# Patient Record
Sex: Male | Born: 1985 | Hispanic: No | Marital: Single | Smoking: Current every day smoker
Health system: Southern US, Community
[De-identification: ages and names within clinical notes are randomized; demographics above are authoritative.]

## PROBLEM LIST (undated history)

## (undated) ENCOUNTER — Emergency Department (HOSPITAL_BASED_OUTPATIENT_CLINIC_OR_DEPARTMENT_OTHER): Payer: Self-pay

---
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
## 2012-01-17 ENCOUNTER — Encounter (HOSPITAL_COMMUNITY): Payer: Self-pay | Admitting: *Deleted

## 2012-01-17 ENCOUNTER — Emergency Department (HOSPITAL_COMMUNITY): Payer: No Typology Code available for payment source

## 2012-01-17 ENCOUNTER — Emergency Department (HOSPITAL_COMMUNITY)
Admission: EM | Admit: 2012-01-17 | Discharge: 2012-01-17 | Disposition: A | Discharge status: Home / Self Care | Payer: No Typology Code available for payment source | Attending: Emergency Medicine | Admitting: Emergency Medicine

## 2012-01-17 DIAGNOSIS — M25519 Pain in unspecified shoulder: Secondary | ICD-10-CM | POA: Insufficient documentation

## 2012-01-17 DIAGNOSIS — F17200 Nicotine dependence, unspecified, uncomplicated: Secondary | ICD-10-CM | POA: Insufficient documentation

## 2012-01-17 DIAGNOSIS — S40012A Contusion of left shoulder, initial encounter: Secondary | ICD-10-CM

## 2012-01-17 MED ORDER — CYCLOBENZAPRINE HCL 10 MG PO TABS: 10.0000 mg | ORAL_TABLET | Freq: Two times a day (BID) | ORAL | Status: AC | PRN

## 2012-01-17 MED ORDER — NAPROXEN 500 MG PO TABS: 500.0000 mg | ORAL_TABLET | Freq: Two times a day (BID) | ORAL | Status: AC
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
## 2018-01-11 ENCOUNTER — Emergency Department (HOSPITAL_COMMUNITY)
Admission: EM | Admit: 2018-01-11 | Discharge: 2018-01-12 | Disposition: A | Discharge status: Home / Self Care | Payer: Self-pay | Attending: Emergency Medicine | Admitting: Emergency Medicine

## 2018-01-11 ENCOUNTER — Other Ambulatory Visit: Payer: Self-pay

## 2018-01-11 ENCOUNTER — Encounter (HOSPITAL_COMMUNITY): Payer: Self-pay | Admitting: Emergency Medicine

## 2018-01-11 ENCOUNTER — Emergency Department (HOSPITAL_COMMUNITY): Payer: Self-pay

## 2018-01-11 DIAGNOSIS — F17210 Nicotine dependence, cigarettes, uncomplicated: Secondary | ICD-10-CM | POA: Insufficient documentation

## 2018-01-11 DIAGNOSIS — M545 Low back pain, unspecified: Secondary | ICD-10-CM

## 2018-01-11 DIAGNOSIS — R52 Pain, unspecified: Secondary | ICD-10-CM

## 2018-01-11 MED ORDER — KETOROLAC TROMETHAMINE 60 MG/2ML IM SOLN
15.0000 mg | Freq: Once | INTRAMUSCULAR | Status: AC
  Administered 2018-01-11: 15 mg via INTRAMUSCULAR
  Filled 2018-01-11: qty 2

## 2018-01-11 MED ORDER — METHOCARBAMOL 500 MG PO TABS: 500.0000 mg | ORAL_TABLET | Freq: Two times a day (BID) | ORAL | 0 refills | Status: DC

## 2018-01-11 NOTE — ED Provider Notes (Signed)
[facility] Provider Note   CSN: [id number] Arrival date & time: 01/11/18  2015     History   Chief Complaint Chief Complaint  Patient presents with  . Back Pain    HPI John Acosta is a 32 y.o. male presenting for right low back pain that began this morning approximately 7 AM.  Patient states that he was moving boxes at work, he works night shift, when he developed pain.  Patient states that pain gradually increased throughout the day.  Patient states that his pain is now 6/10 in severity, he describes as a throbbing pain that is worsened with leaning to the side and with deep breaths.  Patient has not taken anything for his pain. Patient denies history of fevers, headache, chest pain, shortness of breath, nausea, vomiting, urinary incontinence, fecal incontinence, numbness, weakness, tingling.  HPI  History reviewed. No pertinent past medical history.  There are no active problems to display for this patient.   History reviewed. No pertinent surgical history.      Home Medications    Prior to Admission medications   Medication Sig Start Date End Date Taking? Authorizing Provider  methocarbamol (ROBAXIN) 500 MG tablet Take 1 tablet (500 mg total) by mouth 2 (two) times daily. 01/11/18   Bill SalinasMorelli, Brandon A, PA-C    Family History No family history on file.  Social History Social History   Tobacco Use  . Smoking status: Current Every Day Smoker    Packs/day: 1.00  . Smokeless tobacco: Never Used  Substance Use Topics  . Alcohol use: Yes    Comment: occ  . Drug use: No     Allergies   Patient has no known allergies.   Review of Systems Review of Systems  Constitutional: Negative.  Negative for chills, fatigue and fever.  HENT: Negative.  Negative for congestion, rhinorrhea and sore throat.   Eyes: Negative.  Negative for visual disturbance.  Respiratory: Negative.  Negative for cough, chest tightness and  shortness of breath.   Cardiovascular: Negative.  Negative for chest pain.  Gastrointestinal: Negative.  Negative for abdominal pain, diarrhea, nausea and vomiting.  Genitourinary: Negative.  Negative for dysuria and hematuria.  Musculoskeletal: Positive for back pain. Negative for gait problem, neck pain and neck stiffness.  Skin: Negative.  Negative for rash and wound.  Neurological: Negative.  Negative for dizziness, syncope, weakness, light-headedness, numbness and headaches.     Physical Exam Updated Vital Signs BP 124/89 (BP Location: Right Arm)   Pulse (!) 58   Temp 97.9 F (36.6 C) (Oral)   Resp 16   Ht 5\' 8"  (1.727 m)   Wt 80.7 kg (178 lb)   SpO2 100%   BMI 27.06 kg/m   Physical Exam  Constitutional: He is oriented to person, place, and time. He appears well-developed and well-nourished. No distress.  HENT:  Head: Normocephalic and atraumatic.  Right Ear: External ear normal.  Left Ear: External ear normal.  Nose: Nose normal.  Mouth/Throat: Oropharynx is clear and moist.  Eyes: Pupils are equal, round, and reactive to light. EOM are normal.  Neck: Normal range of motion. Neck supple. No JVD present. No tracheal deviation present.  Cardiovascular: Normal rate, regular rhythm and normal heart sounds.  Pulmonary/Chest: Effort normal and breath sounds normal. No accessory muscle usage or stridor. No respiratory distress. He has no decreased breath sounds. He has no wheezes. He has no rhonchi. He has no rales. He exhibits no tenderness, no  crepitus and no deformity.  Abdominal: Soft. There is no tenderness. There is no rebound and no guarding.  Musculoskeletal: Normal range of motion. He exhibits no edema or deformity.       Cervical back: Normal. He exhibits no tenderness, no bony tenderness, no swelling and no deformity.       Thoracic back: Normal. He exhibits no tenderness, no bony tenderness, no swelling and no deformity.       Lumbar back: Normal. He exhibits no bony  tenderness, no swelling and no deformity.       Back:  No midline spine TTP, no paraspinal muscle tenderness, no deformity, crepitus, or step-off noted   Neurological: He is alert and oriented to person, place, and time. He has normal strength. No cranial nerve deficit or sensory deficit.  Skin: Skin is warm and dry. He is not diaphoretic.  Psychiatric: He has a normal mood and affect. His behavior is normal.     ED Treatments / Results  Labs (all labs ordered are listed, but only abnormal results are displayed) Labs Reviewed - No data to display  EKG None  Radiology  Dg Chest 2 View  Result Date: 01/11/2018 CLINICAL DATA:  32 y/o  M; right posterior rib pain. EXAM: CHEST - 2 VIEW; RIGHT RIBS - 2 VIEW COMPARISON:  None. FINDINGS: Chest: The heart size and mediastinal contours are within normal limits. Both lungs are clear. The visualized skeletal structures are unremarkable. Right ribs: No acute fracture or dislocation.  No suspicious osseous lesion. IMPRESSION: 1. No acute pulmonary process. 2. No acute osseous abnormality identified. Electronically Signed   By: Mitzi Hansen M.D.   On: 01/11/2018 22:07   Dg Ribs Unilateral Right  Result Date: 01/11/2018 CLINICAL DATA:  32 y/o  M; right posterior rib pain. EXAM: CHEST - 2 VIEW; RIGHT RIBS - 2 VIEW COMPARISON:  None. FINDINGS: Chest: The heart size and mediastinal contours are within normal limits. Both lungs are clear. The visualized skeletal structures are unremarkable. Right ribs: No acute fracture or dislocation.  No suspicious osseous lesion. IMPRESSION: 1. No acute pulmonary process. 2. No acute osseous abnormality identified. Electronically Signed   By: Mitzi Hansen M.D.   On: 01/11/2018 22:07    Procedures Procedures (including critical care time)  Medications Ordered in ED Medications  ketorolac (TORADOL) injection 15 mg (15 mg Intramuscular Given 01/11/18 2307)     Initial Impression / Assessment  and Plan / ED Course  I have reviewed the triage vital signs and the nursing notes.  Pertinent labs & imaging results that were available during my care of the patient were reviewed by me and considered in my medical decision making (see chart for details).  Patient with right back pain.  No neurological deficits and normal neuro exam.  Patient can walk without pain.  No loss of bowel or bladder control.  No concern for cauda equina.  No fever, night sweats, weight loss, h/o cancer, IVDU.  Patient is low risk group for Wells criteria, 0 points.  Patient is PERC negative.  Pain is most likely musculoskeletal in nature, pain is worsened with palpation to the posterior lower right ribs.  Patient's pain improved with anti-inflammatories in department.  Patient will be discharged with muscle relaxers and encouraged to take over-the-counter Tylenol as needed for pain..  Patient informed not to drive while taking Robaxin.  Patient encouraged to follow-up with his primary care provider for his visit today.  Patient encouraged to return to the  emergency department for any new or worsening symptoms.  At this time there does not appear to be any evidence of an acute emergency medical condition and the patient appears stable for discharge with appropriate outpatient follow up. Diagnosis was discussed with patient who verbalizes understanding and is agreeable to discharge. I have discussed return precautions with patient who verbalize understanding of return precautions. Patient strongly encouraged to follow-up with their PCP.  This note was dictated using DragonOne dictation software; please contact for any inconsistencies within the note.    Final Clinical Impressions(s) / ED Diagnoses   Final diagnoses:  Acute right-sided low back pain without sciatica    ED Discharge Orders        Ordered    methocarbamol (ROBAXIN) 500 MG tablet  2 times daily     01/11/18 2359       Elizabeth Palau 01/12/18 0250    Jacalyn Lefevre, MD 01/16/18 [phone]

## 2018-01-11 NOTE — ED Triage Notes (Signed)
Pt states that today at work while twisting felt a pain in his right mid back.  When he takes a deep breath he feels pain. No other symptoms.

## 2018-01-12 NOTE — Discharge Instructions (Addendum)
Please follow-up with your primary care provider regarding your visit today. Please return to the emergency department for any new or worsening symptoms. You may use Robaxin as prescribed for pain, do not drive or operate heavy machinery while taking Robaxin. You may use over-the-counter anti-inflammatory medications such as Tylenol as needed for pain.  Contact a health care provider if: You have pain that is not relieved with rest or medicine. You have increasing pain going down into your legs or buttocks. Your pain does not improve in 2 weeks. You have pain at night. You lose weight. You have a fever or chills. Get help right away if: You develop new bowel or bladder control problems. You have unusual weakness or numbness in your arms or legs. You develop nausea or vomiting. You develop abdominal pain. You feel faint.

## 2019-10-17 IMAGING — CR DG CHEST 2V
2 series · 2 of 2 positions shown · non-contrast
Comparison: None.

CLINICAL DATA: 31 y/o  M; right posterior rib pain.

EXAM:
CHEST - 2 VIEW; RIGHT RIBS - 2 VIEW

[chest pa]
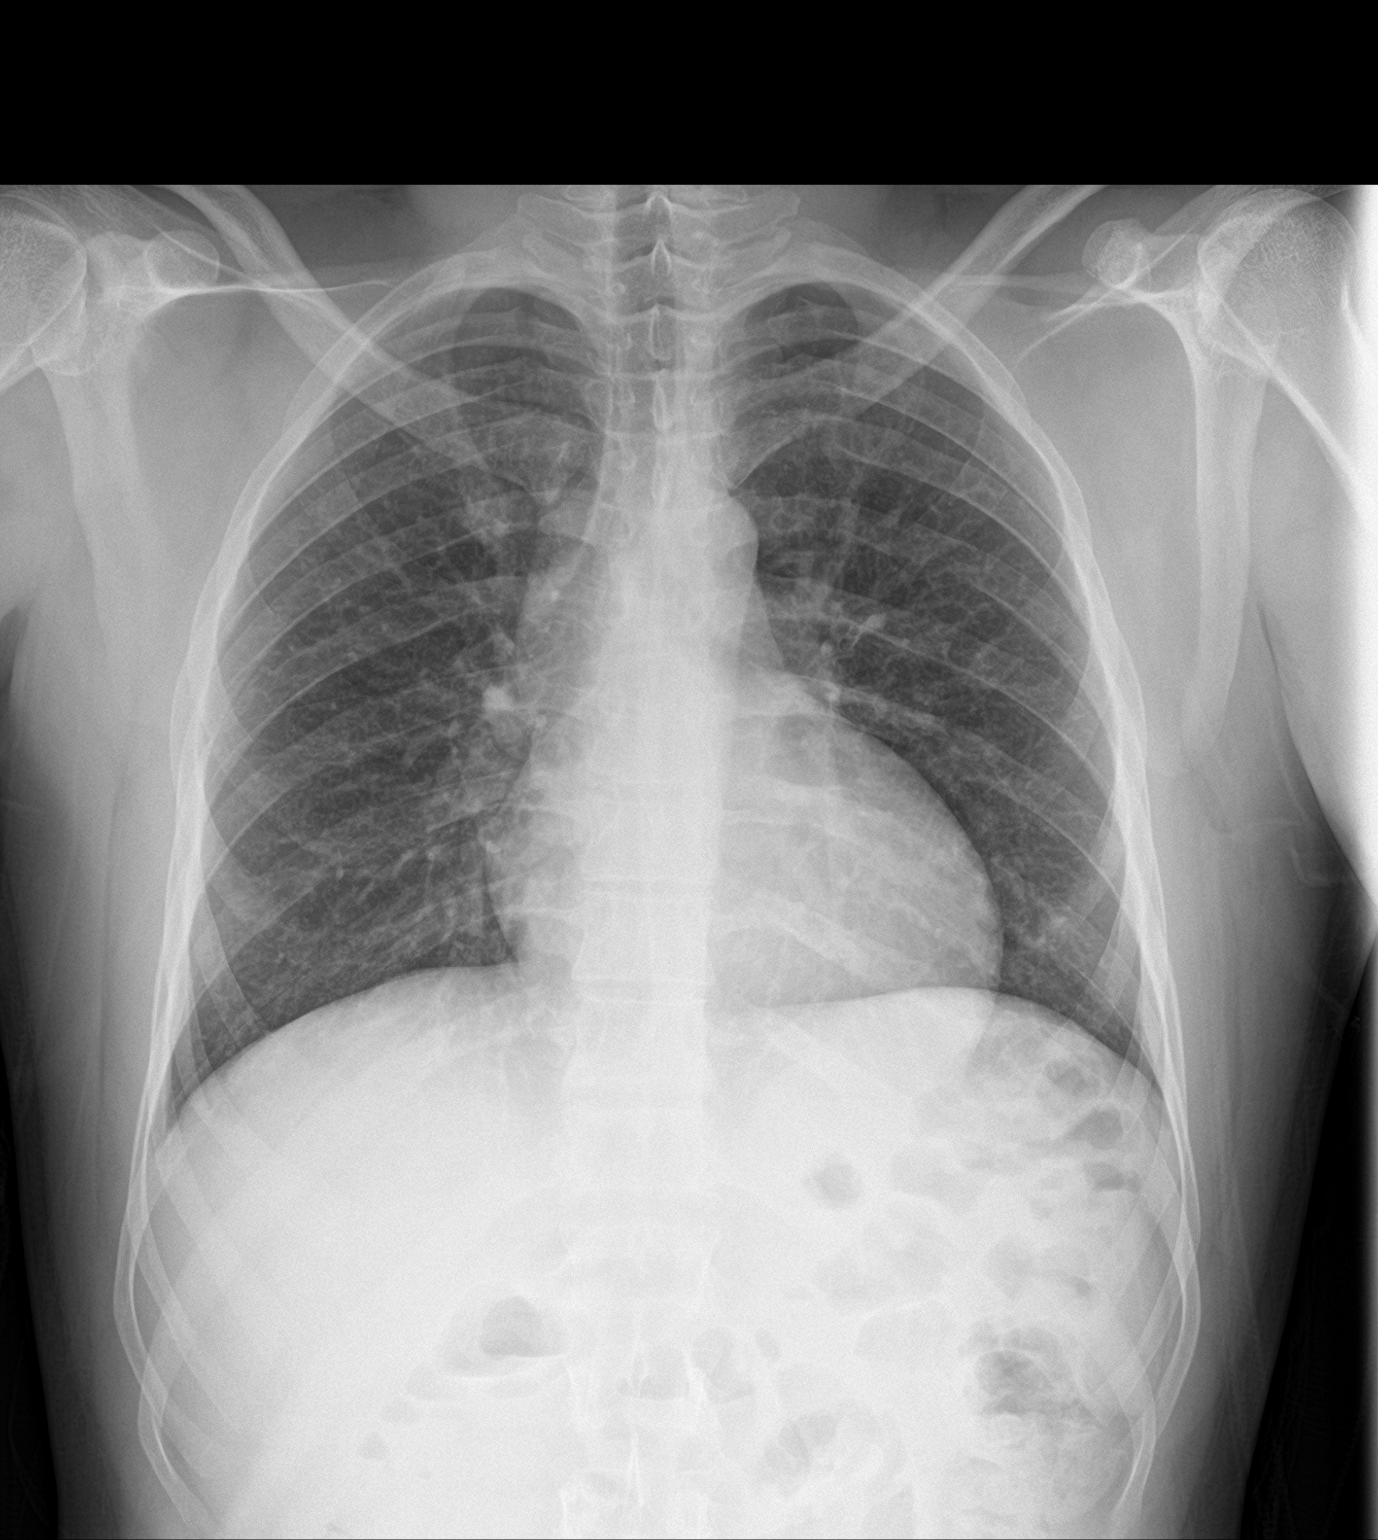

[chest lat]
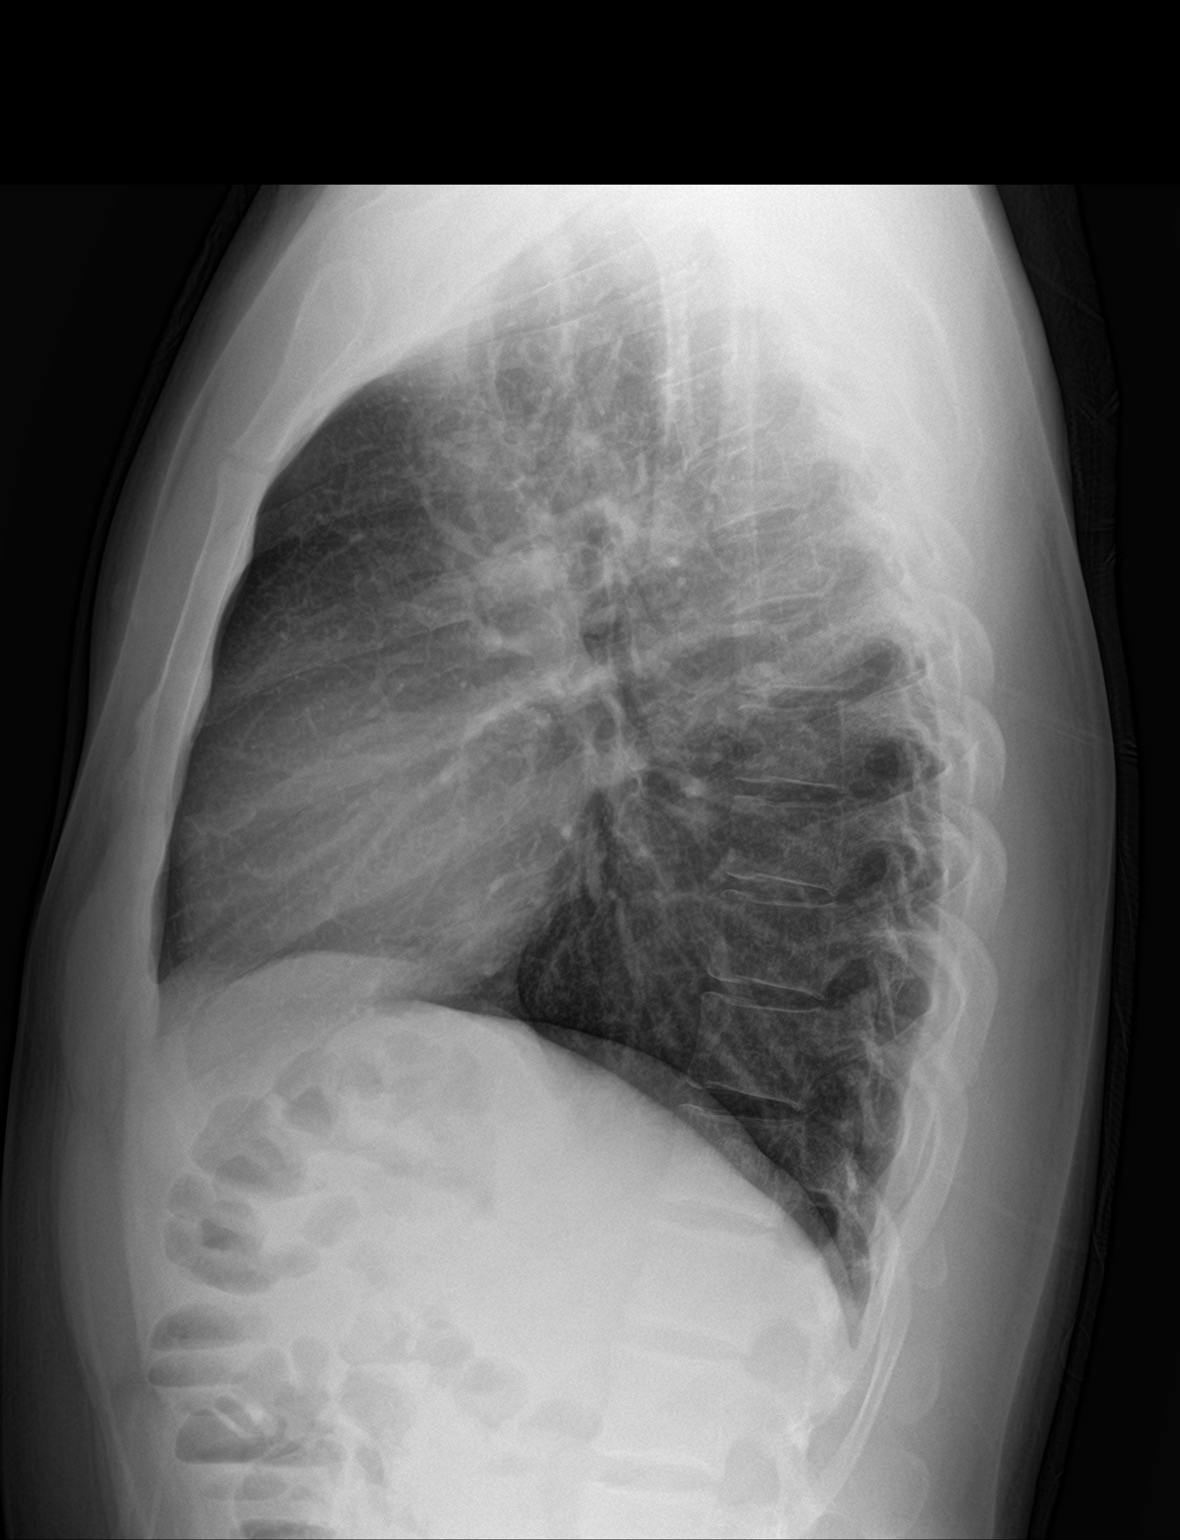

[2 of 2 positions shown; findings below may reference images not displayed]

FINDINGS: Chest:

The heart size and mediastinal contours are within normal limits.
Both lungs are clear. The visualized skeletal structures are
unremarkable.

Right ribs:

No acute fracture or dislocation.  No suspicious osseous lesion.
IMPRESSION: 1. No acute pulmonary process.
2. No acute osseous abnormality identified.

By: Loan Foe M.D.

## 2022-01-25 ENCOUNTER — Ambulatory Visit (HOSPITAL_COMMUNITY)
Admission: EM | Admit: 2022-01-25 | Discharge: 2022-01-25 | Disposition: A | Discharge status: Home / Self Care | Payer: 59

## 2022-01-25 ENCOUNTER — Encounter (HOSPITAL_COMMUNITY): Payer: Self-pay | Admitting: Registered Nurse

## 2022-01-25 DIAGNOSIS — Z638 Other specified problems related to primary support group: Secondary | ICD-10-CM | POA: Diagnosis not present

## 2022-01-25 DIAGNOSIS — Z79899 Other long term (current) drug therapy: Secondary | ICD-10-CM | POA: Diagnosis not present

## 2022-01-25 MED ORDER — HYDROXYZINE HCL 50 MG PO TABS: 50.0000 mg | ORAL_TABLET | Freq: Every day | ORAL | 0 refills | Status: AC | PRN

## 2022-01-25 MED ORDER — TRAZODONE HCL 50 MG PO TABS: 50.0000 mg | ORAL_TABLET | Freq: Every evening | ORAL | 0 refills | Status: AC | PRN

## 2022-01-25 MED ORDER — SERTRALINE HCL 25 MG PO TABS: 25.0000 mg | ORAL_TABLET | Freq: Every day | ORAL | 0 refills | Status: AC
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
## 2022-01-25 NOTE — Discharge Instructions (Addendum)
Good afternoon, Tallie!   Since you have Monia Pouch, you can access their telehealth service for Medication Management (Psychiatry) at [email or web address].  Or, you could go to...   [facility] [address] [place], [place], [address] [phone] phone Completely online treatment platform: Contact: [place] [facility] [employer] [facility]ist [phone] phone [phone] fax ([address] [place], [place], [employer] [facility], [place], [place], [place], [place], [place], [place])   [facility] offers online therapy, but you would have to pay out of pocket or through your EAP.

## 2022-01-25 NOTE — BH Assessment (Signed)
PT requesting medication for anxiety and depression. Pt was given a weeks worth of medications at [facility] back in June. Pt reports he has been spreading his medication out to last him. Pt recently separated from his wife and wife placed a restraining order on pt. Pt reports he has been without his medications for about two weeks and reports he cant get his medications at the house since he has a restraining order on him. Pt denies SI, HI, AVH. Pt requesting medication and outpatient services.

## 2022-01-25 NOTE — ED Notes (Signed)
Patient discharged by provider with written and verbal instructions. 

## 2022-03-24 ENCOUNTER — Emergency Department (HOSPITAL_COMMUNITY)
Admission: EM | Admit: 2022-03-24 | Discharge: 2022-03-24 | Disposition: A | Discharge status: Home / Self Care | Payer: Self-pay | Attending: Emergency Medicine | Admitting: Emergency Medicine

## 2022-03-24 DIAGNOSIS — R451 Restlessness and agitation: Secondary | ICD-10-CM | POA: Insufficient documentation

## 2022-03-24 DIAGNOSIS — F17210 Nicotine dependence, cigarettes, uncomplicated: Secondary | ICD-10-CM | POA: Insufficient documentation

## 2022-03-24 LAB — CBC WITH DIFFERENTIAL/PLATELET
Abs Immature Granulocytes: 0.02 10*3/uL (ref 0.00–0.07)
Basophils Absolute: 0.1 10*3/uL (ref 0.0–0.1)
Basophils Relative: 1 %
Eosinophils Absolute: 0.4 10*3/uL (ref 0.0–0.5)
Eosinophils Relative: 5 %
HCT: 44.3 % (ref 39.0–52.0)
Hemoglobin: 14.7 g/dL (ref 13.0–17.0)
Immature Granulocytes: 0 %
Lymphocytes Relative: 37 %
Lymphs Abs: 2.7 10*3/uL (ref 0.7–4.0)
MCH: 26.6 pg (ref 26.0–34.0)
MCHC: 33.2 g/dL (ref 30.0–36.0)
MCV: 80.3 fL (ref 80.0–100.0)
Monocytes Absolute: 0.7 10*3/uL (ref 0.1–1.0)
Monocytes Relative: 9 %
Neutro Abs: 3.6 10*3/uL (ref 1.7–7.7)
Neutrophils Relative %: 48 %
Platelets: 192 10*3/uL (ref 150–400)
RBC: 5.52 MIL/uL (ref 4.22–5.81)
RDW: 13.6 % (ref 11.5–15.5)
WBC: 7.4 10*3/uL (ref 4.0–10.5)
nRBC: 0 % (ref 0.0–0.2)

## 2022-03-24 LAB — ACETAMINOPHEN LEVEL: Acetaminophen (Tylenol), Serum: <10 ug/mL — ABNORMAL LOW (ref 10–30)

## 2022-03-24 LAB — BASIC METABOLIC PANEL
Anion gap: 8 (ref 5–15)
BUN: 15 mg/dL (ref 6–20)
CO2: 26 mmol/L (ref 22–32)
Calcium: 9.4 mg/dL (ref 8.9–10.3)
Chloride: 106 mmol/L (ref 98–111)
Creatinine, Ser: 1.04 mg/dL (ref 0.61–1.24)
GFR, Estimated: >60 mL/min (ref >60)
Glucose, Bld: 86 mg/dL (ref 70–99)
Potassium: 3 mmol/L — ABNORMAL LOW (ref 3.5–5.1)
Sodium: 140 mmol/L (ref 135–145)

## 2022-03-24 LAB — ETHANOL: Alcohol, Ethyl (B): <10 mg/dL (ref <10)

## 2022-03-24 LAB — SALICYLATE LEVEL: Salicylate Lvl: <7 mg/dL — ABNORMAL LOW (ref 7.0–30.0)

## 2022-03-24 MED ORDER — POTASSIUM CHLORIDE 20 MEQ PO PACK
60.0000 meq | PACK | Freq: Once | ORAL | Status: AC
  Administered 2022-03-24: 60 meq via ORAL
  Filled 2022-03-24: qty 3

## 2022-03-24 NOTE — ED Provider Notes (Signed)
[facility] DEPT Provider Note  CSN: [id number] Arrival date & time: 03/24/22 1127  Chief Complaint(s) Psychiatric Evaluation  HPI John Acosta is a 36 y.o. male homeless, history of depression presenting in police custody brought in for evaluation.  Per officer, patient was being interrogated about a car break-in, when he began yelling and acting "strangely ".  Due to his agitation, EMS was called who recommended bringing him to the emergency department for medical evaluation.  Currently, the patient denies any symptoms.  He denies specifically headaches, suicidal or homicidal ideation, denies any drug use, denies any chest pain or belly pain, denies any arm or leg pain, reports that he "occasionally" hears hallucinations but nothing currently.   Past Medical History No past medical history on file. Patient Active Problem List   Diagnosis Date Noted   Medication management 01/25/2022   Family discord 01/25/2022   Home Medication(s) Prior to Admission medications   Medication Sig Start Date End Date Taking? Authorizing Provider  hydrOXYzine (ATARAX) 50 MG tablet Take 1 tablet (50 mg total) by mouth daily as needed for anxiety. 01/25/22   Rankin, Shuvon B, NP  sertraline (ZOLOFT) 25 MG tablet Take 1 tablet (25 mg total) by mouth daily. 01/25/22   Rankin, Shuvon B, NP  traZODone (DESYREL) 50 MG tablet Take 1 tablet (50 mg total) by mouth at bedtime as needed for sleep. 01/25/22   Rankin, Mercy Moore, NP                                                                                                                                    Past Surgical History No past surgical history on file. Family History No family history on file.  Social History Social History   Tobacco Use   Smoking status: Every Day    Packs/day: 1.00    Types: Cigarettes   Smokeless tobacco: Never  Substance Use Topics   Alcohol use: Yes    Comment: occ   Drug use: No    Allergies Patient has no known allergies.  Review of Systems Review of Systems  All other systems reviewed and are negative.   Physical Exam Vital Signs  I have reviewed the triage vital signs BP (!) 105/57   Pulse 62   Temp 98.3 F (36.8 C)   Resp 17   SpO2 100%  Physical Exam Vitals and nursing note reviewed.  Constitutional:      General: He is not in acute distress.    Appearance: Normal appearance.  HENT:     Head: Normocephalic and atraumatic.     Mouth/Throat:     Mouth: Mucous membranes are moist.  Eyes:     Conjunctiva/sclera: Conjunctivae normal.  Cardiovascular:     Rate and Rhythm: Normal rate and regular rhythm.  Pulmonary:     Effort: Pulmonary effort is normal. No respiratory distress.     Breath sounds: Normal breath  sounds.  Abdominal:     General: Abdomen is flat.     Palpations: Abdomen is soft.     Tenderness: There is no abdominal tenderness.  Musculoskeletal:     Right lower leg: No edema.     Left lower leg: No edema.  Skin:    General: Skin is warm and dry.     Capillary Refill: Capillary refill takes less than 2 seconds.  Neurological:     Mental Status: He is alert and oriented to person, place, and time. Mental status is at baseline.  Psychiatric:        Mood and Affect: Mood normal.        Behavior: Behavior normal.     Comments: Strange affect, but denies any suicidal or homicidal ideation, linear train of thought, does not appear to be actively responding to internal stimuli.  Calm and cooperative     ED Results and Treatments Labs (all labs ordered are listed, but only abnormal results are displayed) Labs Reviewed  ACETAMINOPHEN LEVEL - Abnormal; Notable for the following components:      Result Value   Acetaminophen (Tylenol), Serum <10 (*)    All other components within normal limits  BASIC METABOLIC PANEL - Abnormal; Notable for the following components:   Potassium 3.0 (*)    All other components within normal limits   SALICYLATE LEVEL - Abnormal; Notable for the following components:   Salicylate Lvl [id number] (*)    All other components within normal limits  CBC WITH DIFFERENTIAL/PLATELET  ETHANOL  RAPID URINE DRUG SCREEN, HOSP PERFORMED                                                                                                                          Radiology No results found.  Pertinent labs & imaging results that were available during my care of the patient were reviewed by me and considered in my medical decision making (see MDM for details).  Medications Ordered in ED Medications  potassium chloride (KLOR-CON) packet 60 mEq (60 mEq Oral Given 03/24/22 1517)                                                                                                                                     Procedures Procedures  (including critical care time)  Medical Decision Making / ED Course   MDM:  35 year old male brought into the  emergency department for agitation.  In the emergency department, patient is calm and cooperative, oriented x3, denies medical complaints.  He denies any psychiatric complaints other than occasionally hearing reported hallucinations although he denies this currently and is not reacting to internal stimuli, appears to have an near train of thought.  No head trauma to suggest intracranial process and patient denies any headaches or recent head trauma.  Patient has no medical complaints, given overall reassuring exam and work-up, will discharge      Additional history obtained: -Additional history obtained from police -External records from outside source obtained and reviewed including: Chart review including previous notes, labs, imaging, consultation notes   Lab Tests: -I ordered, reviewed, and interpreted labs.   The pertinent results include:   Labs Reviewed  ACETAMINOPHEN LEVEL - Abnormal; Notable for the following components:      Result Value   Acetaminophen  (Tylenol), Serum <10 (*)    All other components within normal limits  BASIC METABOLIC PANEL - Abnormal; Notable for the following components:   Potassium 3.0 (*)    All other components within normal limits  SALICYLATE LEVEL - Abnormal; Notable for the following components:   Salicylate Lvl <2.9 (*)    All other components within normal limits  CBC WITH DIFFERENTIAL/PLATELET  ETHANOL  RAPID URINE DRUG SCREEN, HOSP PERFORMED      EKG   EKG Interpretation  Date/Time:    Ventricular Rate:    PR Interval:    QRS Duration:   QT Interval:    QTC Calculation:   R Axis:     Text Interpretation:            Medicines ordered and prescription drug management: Meds ordered this encounter  Medications   potassium chloride (KLOR-CON) packet 60 mEq    -I have reviewed the patients home medicines and have made adjustments as needed  Social Determinants of Health:  Factors impacting patients care include: homelessness   Reevaluation: After the interventions noted above, I reevaluated the patient and found that they have stayed the same  Co morbidities that complicate the patient evaluation No past medical history on file.    Dispostion: Discharge    Final Clinical Impression(s) / ED Diagnoses Final diagnoses:  Agitation     This chart was dictated using voice recognition software.  Despite best efforts to proofread,  errors can occur which can change the documentation meaning.    Cristie Hem, MD 03/24/22 [phone]

## 2022-03-24 NOTE — ED Triage Notes (Signed)
In police custody, brought in for pych assessment. Pt is experiencing paranoia- saying that people are "out to get me", and is sensitive to sudden movement.

## 2022-03-28 ENCOUNTER — Emergency Department (HOSPITAL_COMMUNITY): Payer: Self-pay

## 2022-03-28 ENCOUNTER — Other Ambulatory Visit: Payer: Self-pay

## 2022-03-28 ENCOUNTER — Encounter (HOSPITAL_COMMUNITY): Payer: Self-pay

## 2022-03-28 ENCOUNTER — Emergency Department (HOSPITAL_COMMUNITY)
Admission: EM | Admit: 2022-03-28 | Discharge: 2022-03-28 | Disposition: A | Discharge status: Home / Self Care | Payer: Self-pay | Attending: Emergency Medicine | Admitting: Emergency Medicine

## 2022-03-28 DIAGNOSIS — T50901A Poisoning by unspecified drugs, medicaments and biological substances, accidental (unintentional), initial encounter: Secondary | ICD-10-CM | POA: Insufficient documentation

## 2022-03-28 DIAGNOSIS — R799 Abnormal finding of blood chemistry, unspecified: Secondary | ICD-10-CM | POA: Insufficient documentation

## 2022-03-28 DIAGNOSIS — Z5900 Homelessness unspecified: Secondary | ICD-10-CM | POA: Insufficient documentation

## 2022-03-28 DIAGNOSIS — N179 Acute kidney failure, unspecified: Secondary | ICD-10-CM | POA: Insufficient documentation

## 2022-03-28 DIAGNOSIS — X58XXXA Exposure to other specified factors, initial encounter: Secondary | ICD-10-CM | POA: Insufficient documentation

## 2022-03-28 LAB — CBC WITH DIFFERENTIAL/PLATELET
Abs Immature Granulocytes: 0.08 10*3/uL — ABNORMAL HIGH (ref 0.00–0.07)
Basophils Absolute: 0 10*3/uL (ref 0.0–0.1)
Basophils Relative: 0 %
Eosinophils Absolute: 0 10*3/uL (ref 0.0–0.5)
Eosinophils Relative: 0 %
HCT: 44.3 % (ref 39.0–52.0)
Hemoglobin: 14.1 g/dL (ref 13.0–17.0)
Immature Granulocytes: 1 %
Lymphocytes Relative: 4 %
Lymphs Abs: 0.5 10*3/uL — ABNORMAL LOW (ref 0.7–4.0)
MCH: 26.4 pg (ref 26.0–34.0)
MCHC: 31.8 g/dL (ref 30.0–36.0)
MCV: 82.8 fL (ref 80.0–100.0)
Monocytes Absolute: 0.3 10*3/uL (ref 0.1–1.0)
Monocytes Relative: 2 %
Neutro Abs: 11.8 10*3/uL — ABNORMAL HIGH (ref 1.7–7.7)
Neutrophils Relative %: 93 %
Platelets: 202 10*3/uL (ref 150–400)
RBC: 5.35 MIL/uL (ref 4.22–5.81)
RDW: 14.3 % (ref 11.5–15.5)
WBC: 12.7 10*3/uL — ABNORMAL HIGH (ref 4.0–10.5)
nRBC: 0 % (ref 0.0–0.2)

## 2022-03-28 LAB — BASIC METABOLIC PANEL
Anion gap: 8 (ref 5–15)
BUN: 15 mg/dL (ref 6–20)
CO2: 25 mmol/L (ref 22–32)
Calcium: 8.6 mg/dL — ABNORMAL LOW (ref 8.9–10.3)
Chloride: 108 mmol/L (ref 98–111)
Creatinine, Ser: 1.1 mg/dL (ref 0.61–1.24)
GFR, Estimated: >60 mL/min (ref >60)
Glucose, Bld: 98 mg/dL (ref 70–99)
Potassium: 3.9 mmol/L (ref 3.5–5.1)
Sodium: 141 mmol/L (ref 135–145)

## 2022-03-28 LAB — RAPID URINE DRUG SCREEN, HOSP PERFORMED
Amphetamines: NOT DETECTED
Barbiturates: NOT DETECTED
Benzodiazepines: NOT DETECTED
Cocaine: POSITIVE — AB
Opiates: NOT DETECTED
Tetrahydrocannabinol: NOT DETECTED

## 2022-03-28 LAB — COMPREHENSIVE METABOLIC PANEL
ALT: 61 U/L — ABNORMAL HIGH (ref 0–44)
AST: 73 U/L — ABNORMAL HIGH (ref 15–41)
Albumin: 4.3 g/dL (ref 3.5–5.0)
Alkaline Phosphatase: 57 U/L (ref 38–126)
Anion gap: 15 (ref 5–15)
BUN: 16 mg/dL (ref 6–20)
CO2: 22 mmol/L (ref 22–32)
Calcium: 8.6 mg/dL — ABNORMAL LOW (ref 8.9–10.3)
Chloride: 104 mmol/L (ref 98–111)
Creatinine, Ser: 1.74 mg/dL — ABNORMAL HIGH (ref 0.61–1.24)
GFR, Estimated: 52 mL/min — ABNORMAL LOW (ref >60)
Glucose, Bld: 351 mg/dL — ABNORMAL HIGH (ref 70–99)
Potassium: 4.4 mmol/L (ref 3.5–5.1)
Sodium: 141 mmol/L (ref 135–145)
Total Bilirubin: 0.7 mg/dL (ref 0.3–1.2)
Total Protein: 7.4 g/dL (ref 6.5–8.1)

## 2022-03-28 LAB — URINALYSIS, ROUTINE W REFLEX MICROSCOPIC
Bacteria, UA: NONE SEEN
Bilirubin Urine: NEGATIVE
Glucose, UA: >=500 mg/dL — AB
Hgb urine dipstick: NEGATIVE
Ketones, ur: NEGATIVE mg/dL
Leukocytes,Ua: NEGATIVE
Nitrite: NEGATIVE
Protein, ur: 100 mg/dL — AB
Specific Gravity, Urine: 1.014 (ref 1.005–1.030)
pH: 6 (ref 5.0–8.0)

## 2022-03-28 LAB — HEMOGLOBIN A1C
Hgb A1c MFr Bld: 4.7 % — ABNORMAL LOW (ref 4.8–5.6)
Mean Plasma Glucose: 88.19 mg/dL

## 2022-03-28 LAB — CBG MONITORING, ED
Glucose-Capillary: 328 mg/dL — ABNORMAL HIGH (ref 70–99)
Glucose-Capillary: 123 mg/dL — ABNORMAL HIGH (ref 70–99)

## 2022-03-28 LAB — ETHANOL: Alcohol, Ethyl (B): <10 mg/dL (ref <10)

## 2022-03-28 LAB — ACETAMINOPHEN LEVEL: Acetaminophen (Tylenol), Serum: <10 ug/mL — ABNORMAL LOW (ref 10–30)

## 2022-03-28 LAB — SALICYLATE LEVEL: Salicylate Lvl: <7 mg/dL — ABNORMAL LOW (ref 7.0–30.0)

## 2022-03-28 MED ORDER — LACTATED RINGERS IV BOLUS
1000.0000 mL | Freq: Once | INTRAVENOUS | Status: AC
  Administered 2022-03-28: 1000 mL via INTRAVENOUS

## 2022-03-28 NOTE — ED Triage Notes (Signed)
Pt BIB [facility] from a shed at home. Pt found on ground unresponsive and not breathing. Fire dept administered 2mg  narcan intranasal and 0.5 IV narcan. Pt vitals stable.upon EMS arrival. Pt CBG 417.

## 2022-03-28 NOTE — ED Notes (Signed)
Pt asleep att. Will re-attempt to collect urine sample later

## 2022-03-28 NOTE — ED Notes (Signed)
Spo2 90-93% while asleep, spo2 maintained 97 or above while awake

## 2022-03-28 NOTE — ED Notes (Signed)
Unable to obtain temp due to pt biting probe and continually breathing through his mouth. Will attempt again.

## 2022-03-28 NOTE — Discharge Instructions (Addendum)
You were seen in the ER for evaluation after your unintentional overdose of crack/cocaine. Likely your medication was laced with an unknown substance as you improved with Narcan. I have listed additional resources for drug use and homelessness. Additionally, you may have sleep apnea. I have included the information for [facility] for you to follow up with. If you have any concern, new or worsening symptoms, please return to the ER.

## 2022-03-28 NOTE — ED Notes (Addendum)
Noted pt O2 sat 88-90 d/t pt sleeping w/ head tilted sideways. Pt awake, O2 sat 97%

## 2022-03-28 NOTE — ED Notes (Signed)
Called lab to add on UA 

## 2022-03-28 NOTE — ED Provider Notes (Signed)
[facility] DEPT Provider Note   CSN: [id number] Arrival date & time: 03/28/22  0604     History Chief Complaint  Patient presents with   Drug Overdose    John Acosta is a 36 y.o. male with history of polysubstance use presents the emergency department for evaluation after being found down.  According to the EMS run sheet, " Arrive on scene to find patient supine on the floor in the care of [facility]. Patient was unresponsive with agonal breathing. Patient had a strong radial pulse. Patient's friend was on scene and stated patient does not usually do opioids and this was on accident. [facility] stated when they arrived on scene patient's friend was performing chest compressions on patient. [facility] stated they administered Narcan prior to EMS arrival. Patient had no signs of improvement with [facility]s Narcan administration. Further ALS care was started on scene and is as noted. Patient had an adequate respiratory rate and became alert to painful stimuli. Patient was rolled onto the titan tarp and picked up and placed on the stretcher where he was secured with five seatbelts and two side rails. Patient was wheeled to the unit. Patient was alert to verbal stimuli. Patient would not speak words at first only make sounds, patient then was able to answer minimal questions."  Patient was transferred over to [facility] long where the patient remained alert to verbal stimuli and was placed on a nonrebreather and he was oxygen levels within safe and appropriate range.  During transport he was alert and oriented x4 afterwards.  On my evaluation, patient was alert and oriented, but did not know how he was at the emergency department.  He reports that he his drug of choice is crack cocaine and that he thinks he may have used some tonight.  Currently he has no complaints other than wondering where his shoes and keys are.  He denies any head pain, neck pain, chest pain, shortness of breath, extremity pain,  nausea, vomiting, chills.  He adamantly denies any suicidal ideations or homicidal ideations and mentions "I love my life".   Drug Overdose Pertinent negatives include no chest pain, no abdominal pain, no headaches and no shortness of breath.       Home Medications Prior to Admission medications   Medication Sig Start Date End Date Taking? Authorizing Provider  cloNIDine (CATAPRES) 0.1 MG tablet Take 0.1 mg by mouth as needed. 02/22/22   [provider]  hydrOXYzine (ATARAX) 50 MG tablet Take 1 tablet (50 mg total) by mouth daily as needed for anxiety. 01/25/22   Rankin, Shuvon B, NP  hydrOXYzine (VISTARIL) 50 MG capsule Take 50 mg by mouth 2 (two) times daily as needed for anxiety. 02/22/22   [provider]  sertraline (ZOLOFT) 25 MG tablet Take 1 tablet (25 mg total) by mouth daily. 01/25/22   Rankin, Shuvon B, NP  sertraline (ZOLOFT) 50 MG tablet Take 50 mg by mouth daily. 02/22/22   [provider]  traZODone (DESYREL) 50 MG tablet Take 1 tablet (50 mg total) by mouth at bedtime as needed for sleep. 01/25/22   Rankin, Shuvon B, NP      Allergies    Patient has no known allergies.    Review of Systems   Review of Systems  Constitutional:  Negative for chills and fever.  Respiratory:  Negative for shortness of breath.   Cardiovascular:  Negative for chest pain.  Gastrointestinal:  Negative for abdominal pain, nausea and vomiting.  Neurological:  Negative for headaches.    Physical Exam Updated Vital Signs BP 101/67   Pulse 76   Temp 97.9 F (36.6 C)   Resp 11   SpO2 91%  Physical Exam Vitals and nursing note reviewed.  Constitutional:      General: He is not in acute distress.    Appearance: He is not toxic-appearing.     Comments: Disheveled, alert, communicative, polite  HENT:     Head: Normocephalic and atraumatic.     Right Ear: Tympanic membrane, ear canal and external ear normal.     Left Ear: Tympanic membrane, ear canal and external ear  normal.     Nose: Nose normal.     Mouth/Throat:     Mouth: Mucous membranes are dry.  Eyes:     General: No scleral icterus.    Extraocular Movements: Extraocular movements intact.     Pupils: Pupils are equal, round, and reactive to light.  Cardiovascular:     Rate and Rhythm: Normal rate and regular rhythm.  Pulmonary:     Effort: Pulmonary effort is normal. No respiratory distress.     Breath sounds: Normal breath sounds.  Abdominal:     General: Abdomen is flat. Bowel sounds are normal.     Palpations: Abdomen is soft.     Tenderness: There is no abdominal tenderness. There is no right CVA tenderness, left CVA tenderness or guarding.  Musculoskeletal:        General: No deformity.     Cervical back: Normal range of motion. No rigidity or tenderness.  Skin:    General: Skin is warm and dry.  Neurological:     General: No focal deficit present.     Mental Status: He is alert and oriented to person, place, and time. Mental status is at baseline.     GCS: GCS eye subscore is 4. GCS verbal subscore is 5. GCS motor subscore is 6.     Cranial Nerves: No cranial nerve deficit.     Sensory: No sensory deficit.     Motor: No weakness or pronator drift.     Comments: GCS 15.  No cranial nerve deficit.  Patient speaking in full sentences with appropriate speech.  No facial asymmetry.  Sensation intact throughout.  Strength 5-5 in patient's upper and lower bilateral extremities.  No pronator drift.     ED Results / Procedures / Treatments   Labs (all labs ordered are listed, but only abnormal results are displayed) Labs Reviewed  COMPREHENSIVE METABOLIC PANEL - Abnormal; Notable for the following components:      Result Value   Glucose, Bld 351 (*)    Creatinine, Ser 1.74 (*)    Calcium 8.6 (*)    AST 73 (*)    ALT 61 (*)    GFR, Estimated 52 (*)    All other components within normal limits  SALICYLATE LEVEL - Abnormal; Notable for the following components:   Salicylate Lvl  <7.0 (*)    All other components within normal limits  ACETAMINOPHEN LEVEL - Abnormal; Notable for the following components:   Acetaminophen (Tylenol), Serum <10 (*)    All other components within normal limits  RAPID URINE DRUG SCREEN, HOSP PERFORMED - Abnormal; Notable for the following components:   Cocaine POSITIVE (*)    All other components within normal limits  CBC WITH DIFFERENTIAL/PLATELET - Abnormal; Notable for the following components:   WBC 12.7 (*)    Neutro Abs 11.8 (*)    Lymphs  Abs 0.5 (*)    Abs Immature Granulocytes 0.08 (*)    All other components within normal limits  URINALYSIS, ROUTINE W REFLEX MICROSCOPIC - Abnormal; Notable for the following components:   Glucose, UA >=500 (*)    Protein, ur 100 (*)    All other components within normal limits  BASIC METABOLIC PANEL - Abnormal; Notable for the following components:   Calcium 8.6 (*)    All other components within normal limits  CBG MONITORING, ED - Abnormal; Notable for the following components:   Glucose-Capillary 328 (*)    All other components within normal limits  CBG MONITORING, ED - Abnormal; Notable for the following components:   Glucose-Capillary 123 (*)    All other components within normal limits  ETHANOL  HEMOGLOBIN A1C    EKG EKG Interpretation  Date/Time:  Monday March 28 2022 06:16:38 EDT Ventricular Rate:  103 PR Interval:  162 QRS Duration: 94 QT Interval:  383 QTC Calculation: 502 R Axis:   90 Text Interpretation: Sinus tachycardia Borderline right axis deviation Probable left ventricular hypertrophy Prolonged QT interval Confirmed by Tilden Fossa [phone]) on 03/28/2022 6:48:40 AM  Radiology DG Chest 2 View  Result Date: 03/28/2022 CLINICAL DATA:  Found on the ground, altered mental status, hypoxia EXAM: CHEST - 2 VIEW COMPARISON:  01/11/2018 FINDINGS: Cardiac size is within normal limits. Increased markings are seen in both parahilar regions, more so on the right side.  There is no focal consolidation. There is no pleural effusion or pneumothorax. IMPRESSION: Linear patchy infiltrates are seen in both parahilar regions, more so on the right side suggesting multifocal atelectasis/pneumonia. Electronically Signed   By: Ernie Avena M.D.   On: 03/28/2022 13:56     Procedures Procedures   Medications Ordered in ED Medications  lactated ringers bolus 1,000 mL (0 mLs Intravenous Stopped 03/28/22 1041)  lactated ringers bolus 1,000 mL (0 mLs Intravenous Stopped 03/28/22 1140)    ED Course/ Medical Decision Making/ A&P                           Medical Decision Making Amount and/or Complexity of Data Reviewed Labs: ordered. Radiology: ordered.   36 year old male presents the emergency department for evaluation after Narcan administration.  Differential diagnosis includes resolved to intentional overdose versus unintentional overdose.  Vital signs are unremarkable.  Physical exam as noted above.  Patient's blood sugar was initially elevated in the 400s with EMS.  Patient is not have a history of hyperglycemia or diabetes per his medical record or from his personal reported history.  We will order labs.  Ethanol less than 10, salicylate level and acetaminophen level undetected.  UDS positive for cocaine.  Urinalysis shows greater than 500 glucose with 100 protein otherwise unremarkable.  He does have some white blood cells seen in his urine although does not have any bacteria, trace, leukocytes.  Patient does have a history or symptoms of a UTI.  CBC shows white also count of 12.7 with a left shift, likely acute phase reaction given Narcan and patient's drug use.  CMP shows elevated glucose at 351 with a creatinine of 1.74 elevated from the patient's baseline around 0.8.  He is a decrease in calcium.  Increased mildly in his AST and ALT.  Likely AKI from patient's homelessness and drug use.  He does not have any diffuse body aches or any other symptoms of  making suggest rhabdo.  I will order him some fluids  for rehydration.  While patient was sleeping, his oxygen would drop into the low 90s, question sleep apnea.  We will have him follow-up outpatient for this.  When patient was awake and talking he was satting 97 to 100% on room air.  With repeat BMP, patient's creatinine has improved to around his baseline given the fluids.  CXR shows linear patchy infiltrates are seen in both parahilar regions, more so on the right side suggesting multifocal atelectasis/pneumonia.  Doubt any pneumonia.  Patient's vital signs are stable, lungs are clear to auscultation.  He does not have any cough or cold symptoms.  Discussed with my attending who does not recommend any additional treatment.  Patient's hyperglycemia likely acute phase or stress reaction, his glucose is now normal and his A1c is normal as well.  I doubt any diabetes.  Again, this is likely a acute phase or stress reaction along with his elevated white blood cell count.  Overall, patient has been here for almost 9 hours, received 2 L of fluid and eaten multiple snacks.  He is well-appearing and clinically sober.  We discussed outpatient inpatient rehab options for assessing his use of drugs.  We discussed the risks of overdose.  Again, he assures me that was not intentional and he does not have any suicidal or homicidal ideations.  He would like to be discharged home.  We discussed return precautions red flag symptoms.  Patient verbalizes understanding agrees to this plan.  Patient is stable be discharged home in good condition.  I discussed this case with my attending physician who cosigned this note including patient's presenting symptoms, physical exam, and planned diagnostics and interventions. Attending physician stated agreement with plan or made changes to plan which were implemented.   Attending physician assessed patient at bedside.  Final Clinical Impression(s) / ED Diagnoses Final  diagnoses:  Acute drug overdose, accidental or unintentional, initial encounter  AKI (acute kidney injury) [facility])  Homelessness    Rx / DC Orders ED Discharge Orders     None         Achille Rich, PA-C 04/02/22 0013    Gloris Manchester, MD 04/03/22 [phone]

## 2022-04-01 NOTE — ED Provider Notes (Incomplete)
[place] DEPT Provider Note   CSN: [id number] Arrival date & time: 03/28/22  5465     History Chief Complaint  Patient presents with  . Drug Overdose    John Acosta is a 36 y.o. male with history of polysubstance use presents the emergency department for evaluation after being found down.  According to the EMS run sheet, " Arrive on scene to find patient supine on the floor in the care of [facility]. Patient was unresponsive with agonal breathing. Patient had a strong radial pulse. Patient's friend was on scene and stated patient does not usually do opioids and this was on accident. [facility] stated when they arrived on scene patient's friend was performing chest compressions on patient. [facility] stated they administered Narcan prior to EMS arrival. Patient had no signs of improvement with [facility]s Narcan administration. Further ALS care was started on scene and is as noted. Patient had an adequate respiratory rate and became alert to painful stimuli. Patient was rolled onto the titan tarp and picked up and placed on the stretcher where he was secured with five seatbelts and two side rails. Patient was wheeled to the unit. Patient was alert to verbal stimuli. Patient would not speak words at first only make sounds, patient then was able to answer minimal questions."  Patient was transferred over to [facility] long where the patient remained alert to verbal stimuli and was placed on a nonrebreather and he was oxygen levels within safe and appropriate range.  During transport he was alert and oriented x4 afterwards.  On my evaluation, patient was alert and oriented, but did not know how he was at the emergency department.  He reports that he his drug of choice is crack cocaine and that he thinks he may have used some tonight.  Currently he has no complaints other than wondering where his shoes and keys are.  He denies any head pain, neck pain, chest pain, shortness of breath, extremity  pain, nausea, vomiting, chills.   Drug Overdose Pertinent negatives include no chest pain, no abdominal pain, no headaches and no shortness of breath.       Home Medications Prior to Admission medications   Medication Sig Start Date End Date Taking? Authorizing Provider  cloNIDine (CATAPRES) 0.1 MG tablet Take 0.1 mg by mouth as needed. 02/22/22   [provider]  hydrOXYzine (ATARAX) 50 MG tablet Take 1 tablet (50 mg total) by mouth daily as needed for anxiety. 01/25/22   Rankin, Shuvon B, NP  hydrOXYzine (VISTARIL) 50 MG capsule Take 50 mg by mouth 2 (two) times daily as needed for anxiety. 02/22/22   [provider]  sertraline (ZOLOFT) 25 MG tablet Take 1 tablet (25 mg total) by mouth daily. 01/25/22   Rankin, Shuvon B, NP  sertraline (ZOLOFT) 50 MG tablet Take 50 mg by mouth daily. 02/22/22   [provider]  traZODone (DESYREL) 50 MG tablet Take 1 tablet (50 mg total) by mouth at bedtime as needed for sleep. 01/25/22   Rankin, Shuvon B, NP      Allergies    Patient has no known allergies.    Review of Systems   Review of Systems  Constitutional:  Negative for chills and fever.  Respiratory:  Negative for shortness of breath.   Cardiovascular:  Negative for chest pain.  Gastrointestinal:  Negative for abdominal pain, nausea and vomiting.  Neurological:  Negative for headaches.    Physical Exam Updated Vital Signs BP 101/67  Pulse 76   Temp 97.9 F (36.6 C)   Resp 11   SpO2 91%  Physical Exam Vitals and nursing note reviewed.  Constitutional:      General: He is not in acute distress.    Appearance: He is not toxic-appearing.     Comments: Disheveled, alert, communicative, polite  HENT:     Head: Normocephalic and atraumatic.     Right Ear: Tympanic membrane, ear canal and external ear normal.     Left Ear: Tympanic membrane, ear canal and external ear normal.     Nose: Nose normal.     Mouth/Throat:     Mouth: Mucous membranes are dry.   Neurological:     Mental Status: He is alert.     ED Results / Procedures / Treatments   Labs (all labs ordered are listed, but only abnormal results are displayed) Labs Reviewed  COMPREHENSIVE METABOLIC PANEL - Abnormal; Notable for the following components:      Result Value   Glucose, Bld 351 (*)    Creatinine, Ser 1.74 (*)    Calcium 8.6 (*)    AST 73 (*)    ALT 61 (*)    GFR, Estimated 52 (*)    All other components within normal limits  SALICYLATE LEVEL - Abnormal; Notable for the following components:   Salicylate Lvl <7.0 (*)    All other components within normal limits  ACETAMINOPHEN LEVEL - Abnormal; Notable for the following components:   Acetaminophen (Tylenol), Serum <10 (*)    All other components within normal limits  RAPID URINE DRUG SCREEN, HOSP PERFORMED - Abnormal; Notable for the following components:   Cocaine POSITIVE (*)    All other components within normal limits  CBC WITH DIFFERENTIAL/PLATELET - Abnormal; Notable for the following components:   WBC 12.7 (*)    Neutro Abs 11.8 (*)    Lymphs Abs 0.5 (*)    Abs Immature Granulocytes 0.08 (*)    All other components within normal limits  URINALYSIS, ROUTINE W REFLEX MICROSCOPIC - Abnormal; Notable for the following components:   Glucose, UA >=500 (*)    Protein, ur 100 (*)    All other components within normal limits  BASIC METABOLIC PANEL - Abnormal; Notable for the following components:   Calcium 8.6 (*)    All other components within normal limits  CBG MONITORING, ED - Abnormal; Notable for the following components:   Glucose-Capillary 328 (*)    All other components within normal limits  CBG MONITORING, ED - Abnormal; Notable for the following components:   Glucose-Capillary 123 (*)    All other components within normal limits  ETHANOL  HEMOGLOBIN A1C    EKG EKG Interpretation  Date/Time:  Monday March 28 2022 06:16:38 EDT Ventricular Rate:  103 PR Interval:  162 QRS  Duration: 94 QT Interval:  383 QTC Calculation: 502 R Axis:   90 Text Interpretation: Sinus tachycardia Borderline right axis deviation Probable left ventricular hypertrophy Prolonged QT interval Confirmed by Tilden Fossa [phone]) on 03/28/2022 6:48:40 AM  Radiology No results found.  Procedures Procedures   Medications Ordered in ED Medications  lactated ringers bolus 1,000 mL (0 mLs Intravenous Stopped 03/28/22 1041)  lactated ringers bolus 1,000 mL (0 mLs Intravenous Stopped 03/28/22 1140)    ED Course/ Medical Decision Making/ A&P                           Medical Decision Making Amount  and/or Complexity of Data Reviewed Labs: ordered. Radiology: ordered.   ***  {Document critical care time when appropriate:1} {Document review of labs and clinical decision tools ie heart score, Chads2Vasc2 etc:1}  {Document your independent review of radiology images, and any outside records:1} {Document your discussion with family members, caretakers, and with consultants:1} {Document social determinants of health affecting pt's care:1} {Document your decision making why or why not admission, treatments were needed:1} Final Clinical Impression(s) / ED Diagnoses Final diagnoses:  None    Rx / DC Orders ED Discharge Orders     None
